# Patient Record
Sex: Male | Born: 1980 | Race: Black or African American | Hispanic: No | Marital: Single | Smoking: Never smoker
Health system: Southern US, Community
[De-identification: ages and names within clinical notes are randomized; demographics above are authoritative.]

## PROBLEM LIST (undated history)

## (undated) HISTORY — PX: KNEE SURGERY: SHX244

---

## 2015-04-06 ENCOUNTER — Emergency Department (HOSPITAL_COMMUNITY)
Admission: EM | Admit: 2015-04-06 | Discharge: 2015-04-06 | Disposition: A | Discharge status: Home / Self Care | Payer: 59 | Attending: Emergency Medicine | Admitting: Emergency Medicine

## 2015-04-06 ENCOUNTER — Encounter (HOSPITAL_COMMUNITY): Payer: Self-pay | Admitting: Nurse Practitioner

## 2015-04-06 ENCOUNTER — Emergency Department (HOSPITAL_COMMUNITY): Payer: 59

## 2015-04-06 DIAGNOSIS — S50311A Abrasion of right elbow, initial encounter: Secondary | ICD-10-CM | POA: Diagnosis not present

## 2015-04-06 DIAGNOSIS — Y9389 Activity, other specified: Secondary | ICD-10-CM | POA: Insufficient documentation

## 2015-04-06 DIAGNOSIS — Y998 Other external cause status: Secondary | ICD-10-CM | POA: Insufficient documentation

## 2015-04-06 DIAGNOSIS — Z23 Encounter for immunization: Secondary | ICD-10-CM | POA: Diagnosis not present

## 2015-04-06 DIAGNOSIS — S51811A Laceration without foreign body of right forearm, initial encounter: Secondary | ICD-10-CM | POA: Insufficient documentation

## 2015-04-06 DIAGNOSIS — S80211A Abrasion, right knee, initial encounter: Secondary | ICD-10-CM | POA: Diagnosis not present

## 2015-04-06 DIAGNOSIS — T1490XA Injury, unspecified, initial encounter: Secondary | ICD-10-CM

## 2015-04-06 DIAGNOSIS — Y92410 Unspecified street and highway as the place of occurrence of the external cause: Secondary | ICD-10-CM | POA: Insufficient documentation

## 2015-04-06 DIAGNOSIS — S59901A Unspecified injury of right elbow, initial encounter: Secondary | ICD-10-CM | POA: Diagnosis present

## 2015-04-06 DIAGNOSIS — T07XXXA Unspecified multiple injuries, initial encounter: Secondary | ICD-10-CM

## 2015-04-06 MED ORDER — TETANUS-DIPHTH-ACELL PERTUSSIS 5-2.5-18.5 LF-MCG/0.5 IM SUSP
0.5000 mL | Freq: Once | INTRAMUSCULAR | Status: AC
  Administered 2015-04-06: 0.5 mL via INTRAMUSCULAR
  Filled 2015-04-06: qty 0.5

## 2015-04-06 MED ORDER — BACITRACIN ZINC 500 UNIT/GM EX OINT: 1.0000 "application " | TOPICAL_OINTMENT | Freq: Two times a day (BID) | CUTANEOUS | Status: AC

## 2015-04-06 MED ORDER — NAPROXEN 500 MG PO TABS: 500.0000 mg | ORAL_TABLET | Freq: Two times a day (BID) | ORAL | Status: AC

## 2015-04-06 MED ORDER — BACITRACIN ZINC 500 UNIT/GM EX OINT
TOPICAL_OINTMENT | Freq: Two times a day (BID) | CUTANEOUS | Status: DC
  Administered 2015-04-06: 1 via TOPICAL
  Filled 2015-04-06: qty 0.9

## 2015-04-06 MED ORDER — BACITRACIN 500 UNIT/GM EX OINT: 1.0000 "application " | TOPICAL_OINTMENT | Freq: Two times a day (BID) | CUTANEOUS | Status: DC

## 2015-04-06 MED ORDER — NAPROXEN 250 MG PO TABS
500.0000 mg | ORAL_TABLET | Freq: Once | ORAL | Status: AC
  Administered 2015-04-06: 500 mg via ORAL
  Filled 2015-04-06: qty 2

## 2015-04-06 NOTE — ED Notes (Signed)
Pt verbalizes understanding of d/c instructions and denies any further needs at this time. 

## 2015-04-06 NOTE — Discharge Instructions (Signed)
Tetanus updated Ibuprofen or Naprosyn for pain Topical antibiotics with dressing changes 2 times a day Wash gently with warm soapy water and pat dry.  Please obtain all of your results from medical records or have your doctors office obtain the results - share them with your doctor - you should be seen at your doctors office in the next 2 days. Call today to arrange your follow up. Take the medications as prescribed. Please review all of the medicines and only take them if you do not have an allergy to them. Please be aware that if you are taking birth control pills, taking other prescriptions, ESPECIALLY ANTIBIOTICS may make the birth control ineffective - if this is the case, either do not engage in sexual activity or use alternative methods of birth control such as condoms until you have finished the medicine and your family doctor says it is OK to restart them. If you are on a blood thinner such as COUMADIN, be aware that any other medicine that you take may cause the coumadin to either work too much, or not enough - you should have your coumadin level rechecked in next 7 days if this is the case.  ?  It is also a possibility that you have an allergic reaction to any of the medicines that you have been prescribed - Everybody reacts differently to medications and while MOST people have no trouble with most medicines, you may have a reaction such as nausea, vomiting, rash, swelling, shortness of breath. If this is the case, please stop taking the medicine immediately and contact your physician.  ?  You should return to the ER if you develop severe or worsening symptoms.
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
## 2015-04-28 ENCOUNTER — Encounter (HOSPITAL_COMMUNITY): Payer: Self-pay | Admitting: Emergency Medicine

## 2015-04-28 ENCOUNTER — Emergency Department (INDEPENDENT_AMBULATORY_CARE_PROVIDER_SITE_OTHER)
Admission: EM | Admit: 2015-04-28 | Discharge: 2015-04-28 | Disposition: A | Discharge status: Home / Self Care | Payer: 59 | Source: Home / Self Care | Attending: Emergency Medicine | Admitting: Emergency Medicine

## 2015-04-28 DIAGNOSIS — T148 Other injury of unspecified body region: Secondary | ICD-10-CM | POA: Diagnosis not present

## 2015-04-28 DIAGNOSIS — T07XXXA Unspecified multiple injuries, initial encounter: Secondary | ICD-10-CM
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
## 2019-04-05 ENCOUNTER — Other Ambulatory Visit: Payer: Self-pay | Admitting: Critical Care Medicine

## 2019-04-05 DIAGNOSIS — Z20822 Contact with and (suspected) exposure to covid-19: Secondary | ICD-10-CM

## 2019-04-07 LAB — NOVEL CORONAVIRUS, NAA: SARS-CoV-2, NAA: NOT DETECTED

## 2019-04-08 ENCOUNTER — Telehealth: Payer: Self-pay | Admitting: General Practice

## 2019-04-08 NOTE — Telephone Encounter (Signed)
Will forward message to Dr. Joya Gaskins
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
